# Patient Record
Sex: Male | Born: 1975 | Race: White | Hispanic: No | Marital: Married
Health system: Southern US, Community
[De-identification: ages and names within clinical notes are randomized; demographics above are authoritative.]

---

## 2012-01-17 ENCOUNTER — Other Ambulatory Visit: Payer: Self-pay | Admitting: Physician Assistant

## 2012-01-17 ENCOUNTER — Ambulatory Visit
Admission: RE | Admit: 2012-01-17 | Discharge: 2012-01-17 | Disposition: A | Discharge status: Home / Self Care | Payer: Federal, State, Local not specified - PPO | Source: Ambulatory Visit | Attending: Physician Assistant | Admitting: Physician Assistant

## 2012-01-17 DIAGNOSIS — M25561 Pain in right knee: Secondary | ICD-10-CM

## 2013-09-23 IMAGING — CR DG KNEE AP/LAT W/ SUNRISE*R*
3 series · 3 of 3 positions shown · non-contrast
Comparison: None.

CLINICAL DATA: Right knee pain

DG KNEE - 3 VIEWS

[view not recorded (1 of 3)]
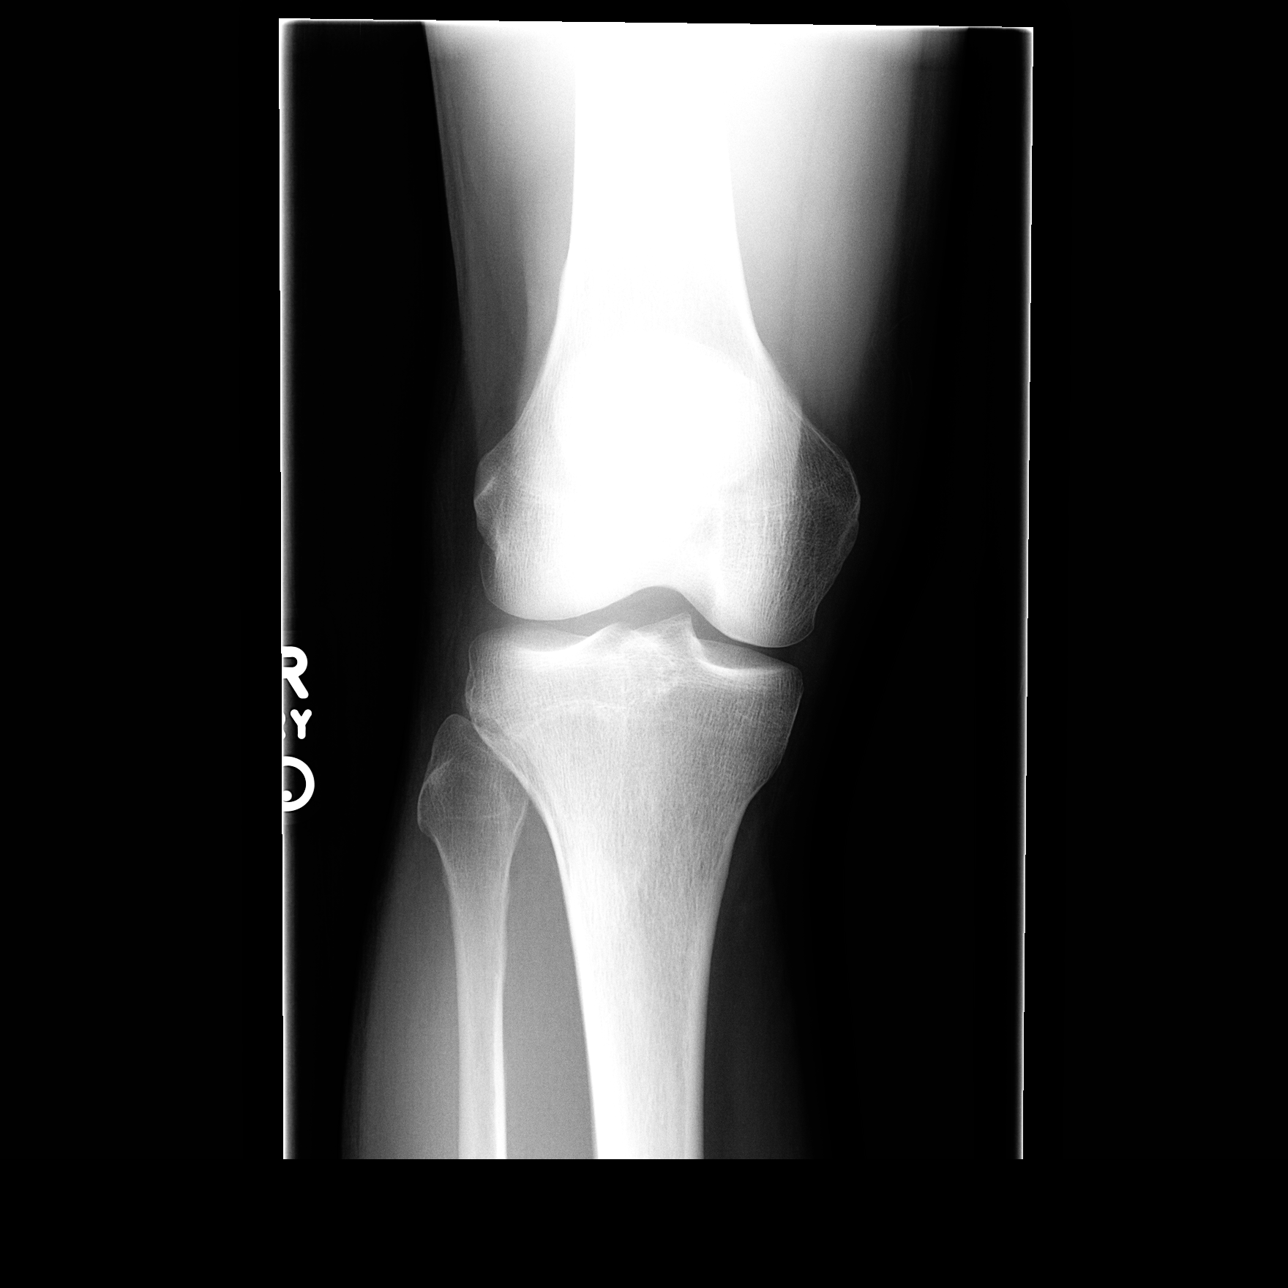

[view not recorded (2 of 3)]
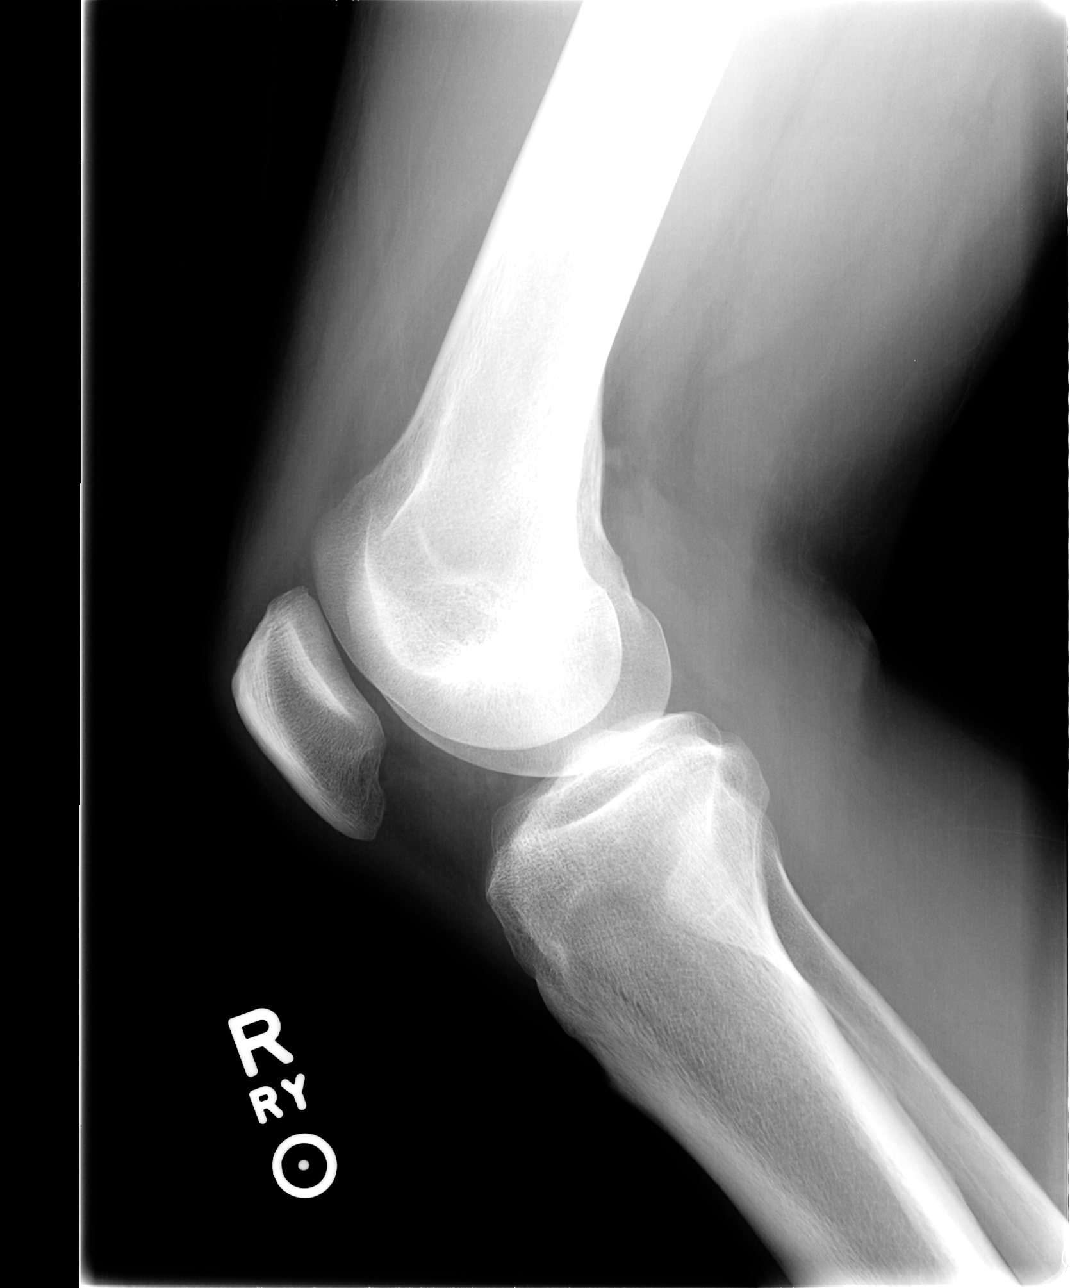

[view not recorded (3 of 3)]
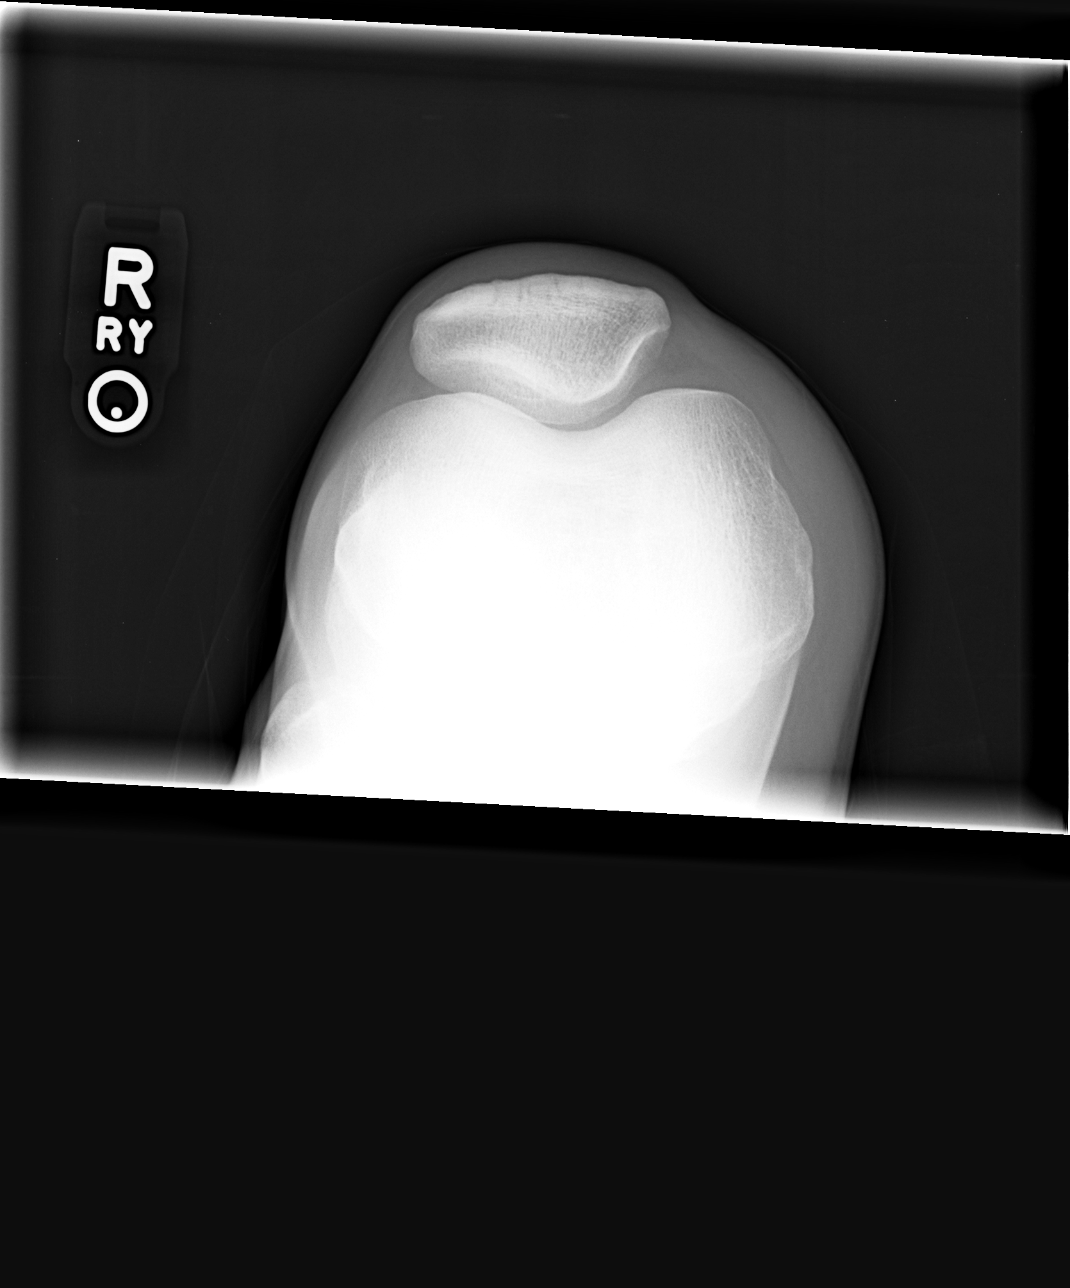

[3 of 3 positions shown; findings below may reference images not displayed]

FINDINGS: Three views of the right knee submitted.  No acute
fracture or subluxation.  No joint effusion.  No pathologic
calcifications are noted.
IMPRESSION: No acute fracture or subluxation.

## 2015-08-08 DIAGNOSIS — K080 Exfoliation of teeth due to systemic causes: Secondary | ICD-10-CM | POA: Diagnosis not present

## 2016-03-11 DIAGNOSIS — K080 Exfoliation of teeth due to systemic causes: Secondary | ICD-10-CM | POA: Diagnosis not present

## 2016-10-01 DIAGNOSIS — K080 Exfoliation of teeth due to systemic causes: Secondary | ICD-10-CM | POA: Diagnosis not present

## 2016-11-01 DIAGNOSIS — L309 Dermatitis, unspecified: Secondary | ICD-10-CM | POA: Diagnosis not present

## 2016-11-01 DIAGNOSIS — Z13220 Encounter for screening for lipoid disorders: Secondary | ICD-10-CM | POA: Diagnosis not present

## 2016-11-01 DIAGNOSIS — L720 Epidermal cyst: Secondary | ICD-10-CM | POA: Diagnosis not present

## 2016-11-01 DIAGNOSIS — Z0001 Encounter for general adult medical examination with abnormal findings: Secondary | ICD-10-CM | POA: Diagnosis not present

## 2017-03-05 DIAGNOSIS — R002 Palpitations: Secondary | ICD-10-CM | POA: Diagnosis not present

## 2017-04-21 DIAGNOSIS — K080 Exfoliation of teeth due to systemic causes: Secondary | ICD-10-CM | POA: Diagnosis not present

## 2017-11-04 DIAGNOSIS — K080 Exfoliation of teeth due to systemic causes: Secondary | ICD-10-CM | POA: Diagnosis not present

## 2018-01-12 DIAGNOSIS — D2371 Other benign neoplasm of skin of right lower limb, including hip: Secondary | ICD-10-CM | POA: Diagnosis not present

## 2019-02-05 DIAGNOSIS — Z0000 Encounter for general adult medical examination without abnormal findings: Secondary | ICD-10-CM | POA: Diagnosis not present

## 2019-02-05 DIAGNOSIS — Z125 Encounter for screening for malignant neoplasm of prostate: Secondary | ICD-10-CM | POA: Diagnosis not present

## 2019-02-05 DIAGNOSIS — Z13220 Encounter for screening for lipoid disorders: Secondary | ICD-10-CM | POA: Diagnosis not present

## 2019-02-05 DIAGNOSIS — L309 Dermatitis, unspecified: Secondary | ICD-10-CM | POA: Diagnosis not present

## 2019-06-06 ENCOUNTER — Ambulatory Visit: Payer: Federal, State, Local not specified - PPO | Attending: Internal Medicine

## 2019-06-06 DIAGNOSIS — Z23 Encounter for immunization: Secondary | ICD-10-CM | POA: Insufficient documentation

## 2019-06-06 NOTE — Progress Notes (Signed)
   Covid-[facility] Vaccination [facility]  Name:  Dow Blahnik    MRN: [id number] DOB: 02-05-1976  06/06/2019  Mr. Maberry was observed post Covid-19 immunization for 15 minutes without incident. He was provided with Vaccine Information Sheet and instruction to access the V-Safe system.   Mr. Monforte was instructed to call 911 with any severe reactions post vaccine: Marland Kitchen Difficulty breathing  . Swelling of face and throat  . A fast heartbeat  . A bad rash all over body  . Dizziness and weakness   Immunizations Administered    Name Date Dose VIS Date Route   Pfizer COVID-19 Vaccine 06/06/2019  8:48 AM 0.3 mL 03/12/2019 Intramuscular   Manufacturer: [employer], [employer]   Lot: [id number]   NDC: [address]-3

## 2019-06-27 ENCOUNTER — Ambulatory Visit: Payer: Self-pay | Attending: Internal Medicine

## 2019-06-27 DIAGNOSIS — Z23 Encounter for immunization: Secondary | ICD-10-CM

## 2019-06-27 NOTE — Progress Notes (Signed)
   Covid-19 Vaccination Clinic  Name:  Brandon Burgess    MRN: [id number] DOB: 06/04/1975  06/27/2019  [name] was observed post Covid-19 immunization for 15 minutes without incident. He was provided with Vaccine Information Sheet and instruction to access the V-Safe system.   [name] was instructed to call 911 with any severe reactions post vaccine: Marland Kitchen Difficulty breathing  . Swelling of face and throat  . A fast heartbeat  . A bad rash all over body  . Dizziness and weakness   Immunizations Administered    Name Date Dose VIS Date Route   Pfizer COVID-19 Vaccine 06/27/2019  8:58 AM 0.3 mL 03/12/2019 Intramuscular   Manufacturer: [employer], [employer]   Lot: [id number]   NDC: [address]-3

## 2019-12-02 ENCOUNTER — Other Ambulatory Visit: Payer: Self-pay

## 2019-12-02 ENCOUNTER — Other Ambulatory Visit: Payer: Federal, State, Local not specified - PPO

## 2019-12-02 DIAGNOSIS — Z20822 Contact with and (suspected) exposure to covid-19: Secondary | ICD-10-CM

## 2019-12-04 LAB — NOVEL CORONAVIRUS, NAA: SARS-CoV-2, NAA: NOT DETECTED

## 2019-12-16 DIAGNOSIS — Z20822 Contact with and (suspected) exposure to covid-19: Secondary | ICD-10-CM | POA: Diagnosis not present

## 2019-12-25 ENCOUNTER — Other Ambulatory Visit: Payer: Self-pay

## 2019-12-25 ENCOUNTER — Other Ambulatory Visit: Payer: Federal, State, Local not specified - PPO

## 2019-12-25 ENCOUNTER — Other Ambulatory Visit: Payer: Self-pay | Admitting: Internal Medicine

## 2019-12-25 DIAGNOSIS — Z20822 Contact with and (suspected) exposure to covid-19: Secondary | ICD-10-CM

## 2019-12-26 LAB — SARS-COV-2, NAA 2 DAY TAT

## 2019-12-26 LAB — NOVEL CORONAVIRUS, NAA: SARS-CoV-2, NAA: NOT DETECTED

## 2019-12-27 ENCOUNTER — Other Ambulatory Visit: Payer: Federal, State, Local not specified - PPO

## 2019-12-27 DIAGNOSIS — Z20822 Contact with and (suspected) exposure to covid-19: Secondary | ICD-10-CM

## 2019-12-28 LAB — NOVEL CORONAVIRUS, NAA: SARS-CoV-2, NAA: NOT DETECTED

## 2019-12-28 LAB — SARS-COV-2, NAA 2 DAY TAT

## 2020-02-10 DIAGNOSIS — Z13220 Encounter for screening for lipoid disorders: Secondary | ICD-10-CM | POA: Diagnosis not present

## 2020-02-10 DIAGNOSIS — Z0000 Encounter for general adult medical examination without abnormal findings: Secondary | ICD-10-CM | POA: Diagnosis not present

## 2020-02-10 DIAGNOSIS — L309 Dermatitis, unspecified: Secondary | ICD-10-CM | POA: Diagnosis not present

## 2020-02-10 DIAGNOSIS — Z125 Encounter for screening for malignant neoplasm of prostate: Secondary | ICD-10-CM | POA: Diagnosis not present

## 2020-04-08 DIAGNOSIS — Z1152 Encounter for screening for COVID-19: Secondary | ICD-10-CM | POA: Diagnosis not present

## 2020-09-21 DIAGNOSIS — M7051 Other bursitis of knee, right knee: Secondary | ICD-10-CM | POA: Diagnosis not present

## 2020-10-06 DIAGNOSIS — M25561 Pain in right knee: Secondary | ICD-10-CM | POA: Diagnosis not present

## 2020-10-19 DIAGNOSIS — M25561 Pain in right knee: Secondary | ICD-10-CM | POA: Diagnosis not present

## 2021-03-07 DIAGNOSIS — Z125 Encounter for screening for malignant neoplasm of prostate: Secondary | ICD-10-CM | POA: Diagnosis not present

## 2021-03-07 DIAGNOSIS — Z0000 Encounter for general adult medical examination without abnormal findings: Secondary | ICD-10-CM | POA: Diagnosis not present

## 2021-03-07 DIAGNOSIS — Z13220 Encounter for screening for lipoid disorders: Secondary | ICD-10-CM | POA: Diagnosis not present

## 2022-03-08 DIAGNOSIS — Z13220 Encounter for screening for lipoid disorders: Secondary | ICD-10-CM | POA: Diagnosis not present

## 2022-03-08 DIAGNOSIS — Z0000 Encounter for general adult medical examination without abnormal findings: Secondary | ICD-10-CM | POA: Diagnosis not present

## 2022-03-08 DIAGNOSIS — Z125 Encounter for screening for malignant neoplasm of prostate: Secondary | ICD-10-CM | POA: Diagnosis not present

## 2023-03-19 DIAGNOSIS — Z1211 Encounter for screening for malignant neoplasm of colon: Secondary | ICD-10-CM | POA: Diagnosis not present

## 2023-03-19 DIAGNOSIS — Z13220 Encounter for screening for lipoid disorders: Secondary | ICD-10-CM | POA: Diagnosis not present

## 2023-03-19 DIAGNOSIS — M79645 Pain in left finger(s): Secondary | ICD-10-CM | POA: Diagnosis not present

## 2023-03-19 DIAGNOSIS — B001 Herpesviral vesicular dermatitis: Secondary | ICD-10-CM | POA: Diagnosis not present

## 2023-03-19 DIAGNOSIS — L309 Dermatitis, unspecified: Secondary | ICD-10-CM | POA: Diagnosis not present

## 2023-03-19 DIAGNOSIS — Z0000 Encounter for general adult medical examination without abnormal findings: Secondary | ICD-10-CM | POA: Diagnosis not present

## 2023-03-19 DIAGNOSIS — Z23 Encounter for immunization: Secondary | ICD-10-CM | POA: Diagnosis not present

## 2023-05-30 DIAGNOSIS — J3489 Other specified disorders of nose and nasal sinuses: Secondary | ICD-10-CM | POA: Diagnosis not present

## 2023-06-17 DIAGNOSIS — D140 Benign neoplasm of middle ear, nasal cavity and accessory sinuses: Secondary | ICD-10-CM | POA: Diagnosis not present

## 2023-09-16 DIAGNOSIS — D2239 Melanocytic nevi of other parts of face: Secondary | ICD-10-CM | POA: Diagnosis not present

## 2023-09-16 DIAGNOSIS — L821 Other seborrheic keratosis: Secondary | ICD-10-CM | POA: Diagnosis not present

## 2023-09-17 DIAGNOSIS — R739 Hyperglycemia, unspecified: Secondary | ICD-10-CM | POA: Diagnosis not present

## 2023-09-29 DIAGNOSIS — R7303 Prediabetes: Secondary | ICD-10-CM | POA: Diagnosis not present

## 2023-09-29 DIAGNOSIS — R739 Hyperglycemia, unspecified: Secondary | ICD-10-CM | POA: Diagnosis not present

## 2023-11-25 DIAGNOSIS — D485 Neoplasm of uncertain behavior of skin: Secondary | ICD-10-CM | POA: Diagnosis not present

## 2023-11-25 DIAGNOSIS — D2339 Other benign neoplasm of skin of other parts of face: Secondary | ICD-10-CM | POA: Diagnosis not present

## 2023-12-22 DIAGNOSIS — L821 Other seborrheic keratosis: Secondary | ICD-10-CM | POA: Diagnosis not present

## 2023-12-22 DIAGNOSIS — D229 Melanocytic nevi, unspecified: Secondary | ICD-10-CM | POA: Diagnosis not present

## 2023-12-22 DIAGNOSIS — L814 Other melanin hyperpigmentation: Secondary | ICD-10-CM | POA: Diagnosis not present

## 2023-12-22 DIAGNOSIS — L578 Other skin changes due to chronic exposure to nonionizing radiation: Secondary | ICD-10-CM | POA: Diagnosis not present

## 2023-12-30 DIAGNOSIS — Z131 Encounter for screening for diabetes mellitus: Secondary | ICD-10-CM | POA: Diagnosis not present

## 2023-12-30 DIAGNOSIS — K8689 Other specified diseases of pancreas: Secondary | ICD-10-CM | POA: Diagnosis not present

## 2023-12-30 DIAGNOSIS — Z832 Family history of diseases of the blood and blood-forming organs and certain disorders involving the immune mechanism: Secondary | ICD-10-CM | POA: Diagnosis not present

## 2024-03-19 DIAGNOSIS — Z832 Family history of diseases of the blood and blood-forming organs and certain disorders involving the immune mechanism: Secondary | ICD-10-CM | POA: Diagnosis not present

## 2024-03-19 DIAGNOSIS — Z13220 Encounter for screening for lipoid disorders: Secondary | ICD-10-CM | POA: Diagnosis not present

## 2024-03-19 DIAGNOSIS — R0789 Other chest pain: Secondary | ICD-10-CM | POA: Diagnosis not present

## 2024-03-19 DIAGNOSIS — Z0000 Encounter for general adult medical examination without abnormal findings: Secondary | ICD-10-CM | POA: Diagnosis not present

## 2024-03-19 DIAGNOSIS — Z125 Encounter for screening for malignant neoplasm of prostate: Secondary | ICD-10-CM | POA: Diagnosis not present

## 2024-03-19 DIAGNOSIS — H1130 Conjunctival hemorrhage, unspecified eye: Secondary | ICD-10-CM | POA: Diagnosis not present

## 2024-03-19 DIAGNOSIS — R7303 Prediabetes: Secondary | ICD-10-CM | POA: Diagnosis not present
